# Patient Record
Sex: Male | Born: 1955 | Race: White | Hispanic: No | Marital: Single | Smoking: Never smoker
Health system: Southern US, Community
[De-identification: ages and names within clinical notes are randomized; demographics above are authoritative.]

## PROBLEM LIST (undated history)

## (undated) HISTORY — PX: OTHER SURGICAL HISTORY: SHX169

## (undated) HISTORY — PX: NOSE SURGERY: SHX723

---

## 2003-01-04 ENCOUNTER — Emergency Department (HOSPITAL_COMMUNITY): Admission: EM | Admit: 2003-01-04 | Discharge: 2003-01-04 | Payer: Self-pay | Admitting: Emergency Medicine

## 2004-05-22 ENCOUNTER — Emergency Department (HOSPITAL_COMMUNITY): Admission: EM | Admit: 2004-05-22 | Discharge: 2004-05-23 | Payer: Self-pay | Admitting: Emergency Medicine

## 2004-06-24 ENCOUNTER — Emergency Department (HOSPITAL_COMMUNITY): Admission: EM | Admit: 2004-06-24 | Discharge: 2004-06-24 | Payer: Self-pay | Admitting: *Deleted

## 2004-10-31 ENCOUNTER — Ambulatory Visit (HOSPITAL_COMMUNITY): Admission: RE | Admit: 2004-10-31 | Discharge: 2004-10-31 | Payer: Self-pay | Admitting: Orthopaedic Surgery

## 2007-01-11 ENCOUNTER — Ambulatory Visit: Payer: Self-pay | Admitting: Internal Medicine

## 2007-01-14 ENCOUNTER — Ambulatory Visit: Payer: Self-pay | Admitting: *Deleted

## 2007-03-01 ENCOUNTER — Ambulatory Visit (HOSPITAL_COMMUNITY): Admission: RE | Admit: 2007-03-01 | Discharge: 2007-03-01 | Payer: Self-pay | Admitting: Internal Medicine

## 2007-03-01 ENCOUNTER — Ambulatory Visit: Payer: Self-pay | Admitting: Internal Medicine

## 2007-04-16 ENCOUNTER — Ambulatory Visit: Payer: Self-pay | Admitting: Internal Medicine

## 2007-04-17 DIAGNOSIS — R972 Elevated prostate specific antigen [PSA]: Secondary | ICD-10-CM

## 2007-05-17 DIAGNOSIS — K029 Dental caries, unspecified: Secondary | ICD-10-CM | POA: Insufficient documentation

## 2007-05-17 DIAGNOSIS — E785 Hyperlipidemia, unspecified: Secondary | ICD-10-CM

## 2007-05-17 DIAGNOSIS — K219 Gastro-esophageal reflux disease without esophagitis: Secondary | ICD-10-CM | POA: Insufficient documentation

## 2007-11-27 ENCOUNTER — Ambulatory Visit: Payer: Self-pay | Admitting: Internal Medicine

## 2008-06-05 ENCOUNTER — Emergency Department (HOSPITAL_COMMUNITY): Admission: EM | Admit: 2008-06-05 | Discharge: 2008-06-06 | Payer: Self-pay | Admitting: Emergency Medicine

## 2008-06-19 ENCOUNTER — Ambulatory Visit (HOSPITAL_COMMUNITY): Admission: RE | Admit: 2008-06-19 | Discharge: 2008-06-19 | Payer: Self-pay | Admitting: Otolaryngology

## 2008-11-04 ENCOUNTER — Ambulatory Visit: Payer: Self-pay | Admitting: Internal Medicine

## 2008-11-20 ENCOUNTER — Ambulatory Visit (HOSPITAL_BASED_OUTPATIENT_CLINIC_OR_DEPARTMENT_OTHER): Admission: RE | Admit: 2008-11-20 | Discharge: 2008-11-20 | Payer: Self-pay | Admitting: Otolaryngology

## 2008-12-02 ENCOUNTER — Ambulatory Visit: Payer: Self-pay | Admitting: Internal Medicine

## 2008-12-04 ENCOUNTER — Ambulatory Visit: Payer: Self-pay | Admitting: Internal Medicine

## 2009-11-15 ENCOUNTER — Emergency Department (HOSPITAL_COMMUNITY): Admission: EM | Admit: 2009-11-15 | Discharge: 2009-11-16 | Payer: Self-pay | Admitting: Emergency Medicine

## 2009-12-13 ENCOUNTER — Encounter: Payer: Self-pay | Admitting: Emergency Medicine

## 2009-12-13 ENCOUNTER — Inpatient Hospital Stay (HOSPITAL_COMMUNITY): Admission: EM | Admit: 2009-12-13 | Discharge: 2009-12-14 | Payer: Self-pay | Admitting: Cardiology

## 2009-12-13 ENCOUNTER — Ambulatory Visit: Payer: Self-pay | Admitting: Diagnostic Radiology

## 2010-12-16 LAB — BASIC METABOLIC PANEL
CO2: 29 mEq/L (ref 19–32)
Calcium: 9.6 mg/dL (ref 8.4–10.5)
Chloride: 104 mEq/L (ref 96–112)
Creatinine, Ser: 1.01 mg/dL (ref 0.4–1.5)
Glucose, Bld: 101 mg/dL — ABNORMAL HIGH (ref 70–99)
Sodium: 140 mEq/L (ref 135–145)

## 2010-12-16 LAB — CK TOTAL AND CKMB (NOT AT ARMC): CK, MB: 1.9 ng/mL (ref 0.3–4.0)

## 2010-12-16 LAB — TROPONIN I: Troponin I: 0.01 ng/mL (ref 0.00–0.06)

## 2010-12-18 LAB — APTT: aPTT: 27 seconds (ref 24–37)

## 2010-12-18 LAB — COMPREHENSIVE METABOLIC PANEL
AST: 20 U/L (ref 0–37)
BUN: 7 mg/dL (ref 6–23)
CO2: 29 mEq/L (ref 19–32)
Calcium: 9.2 mg/dL (ref 8.4–10.5)
Creatinine, Ser: 0.87 mg/dL (ref 0.4–1.5)
GFR calc Af Amer: >60 mL/min (ref >60)
GFR calc non Af Amer: >60 mL/min (ref >60)
Total Bilirubin: 1 mg/dL (ref 0.3–1.2)

## 2010-12-18 LAB — CARDIAC PANEL(CRET KIN+CKTOT+MB+TROPI)
CK, MB: 1.7 ng/mL (ref 0.3–4.0)
Relative Index: INVALID (ref 0.0–2.5)
Total CK: 70 U/L (ref 7–232)
Troponin I: 0.01 ng/mL (ref 0.00–0.06)
Troponin I: <0.01 ng/mL (ref 0.00–0.06)

## 2010-12-18 LAB — PROTIME-INR
INR: 1.03 (ref 0.00–1.49)
Prothrombin Time: 13.4 seconds (ref 11.6–15.2)

## 2010-12-18 LAB — LIPID PANEL
Cholesterol: 214 mg/dL — ABNORMAL HIGH (ref 0–200)
HDL: 87 mg/dL (ref >39)
LDL Cholesterol: 99 mg/dL (ref 0–99)
Triglycerides: 139 mg/dL (ref <150)

## 2010-12-19 LAB — POCT CARDIAC MARKERS
CKMB, poc: 1.3 ng/mL (ref 1.0–8.0)
Myoglobin, poc: 43.9 ng/mL (ref 12–200)

## 2010-12-19 LAB — BASIC METABOLIC PANEL
CO2: 29 mEq/L (ref 19–32)
Calcium: 8.7 mg/dL (ref 8.4–10.5)
Chloride: 106 mEq/L (ref 96–112)
Creatinine, Ser: 0.9 mg/dL (ref 0.4–1.5)
Glucose, Bld: 92 mg/dL (ref 70–99)

## 2010-12-19 LAB — CBC
MCHC: 34.6 g/dL (ref 30.0–36.0)
MCV: 91.3 fL (ref 78.0–100.0)
RBC: 4.26 MIL/uL (ref 4.22–5.81)
RDW: 12.1 % (ref 11.5–15.5)

## 2010-12-19 LAB — DIFFERENTIAL
Basophils Absolute: 0 10*3/uL (ref 0.0–0.1)
Basophils Relative: 1 % (ref 0–1)
Eosinophils Absolute: 0.2 10*3/uL (ref 0.0–0.7)
Monocytes Absolute: 0.4 10*3/uL (ref 0.1–1.0)
Monocytes Relative: 8 % (ref 3–12)
Neutro Abs: 3 10*3/uL (ref 1.7–7.7)
Neutrophils Relative %: 65 % (ref 43–77)
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
## 2011-05-16 ENCOUNTER — Ambulatory Visit: Payer: Self-pay | Admitting: Rehabilitation

## 2011-05-17 ENCOUNTER — Ambulatory Visit: Payer: Self-pay | Attending: Family Medicine

## 2011-05-17 DIAGNOSIS — IMO0001 Reserved for inherently not codable concepts without codable children: Secondary | ICD-10-CM | POA: Insufficient documentation

## 2011-05-17 DIAGNOSIS — M25569 Pain in unspecified knee: Secondary | ICD-10-CM | POA: Insufficient documentation

## 2011-05-19 ENCOUNTER — Encounter: Payer: Self-pay | Admitting: Physical Therapy

## 2011-05-23 ENCOUNTER — Encounter: Payer: Self-pay | Admitting: Physical Therapy

## 2011-05-30 ENCOUNTER — Encounter: Payer: Self-pay | Admitting: Physical Therapy

## 2011-06-01 ENCOUNTER — Emergency Department (HOSPITAL_COMMUNITY)
Admission: EM | Admit: 2011-06-01 | Discharge: 2011-06-01 | Disposition: A | Discharge status: Home / Self Care | Payer: Self-pay | Attending: Emergency Medicine | Admitting: Emergency Medicine

## 2011-06-01 DIAGNOSIS — S01502A Unspecified open wound of oral cavity, initial encounter: Secondary | ICD-10-CM | POA: Insufficient documentation

## 2011-06-01 DIAGNOSIS — S0180XA Unspecified open wound of other part of head, initial encounter: Secondary | ICD-10-CM | POA: Insufficient documentation

## 2011-06-01 DIAGNOSIS — S0003XA Contusion of scalp, initial encounter: Secondary | ICD-10-CM | POA: Insufficient documentation

## 2011-06-01 DIAGNOSIS — S1093XA Contusion of unspecified part of neck, initial encounter: Secondary | ICD-10-CM | POA: Insufficient documentation

## 2011-06-03 ENCOUNTER — Emergency Department (HOSPITAL_COMMUNITY): Payer: Self-pay

## 2011-06-03 ENCOUNTER — Emergency Department (HOSPITAL_COMMUNITY)
Admission: EM | Admit: 2011-06-03 | Discharge: 2011-06-03 | Disposition: A | Discharge status: Home / Self Care | Payer: Self-pay | Attending: Emergency Medicine | Admitting: Emergency Medicine

## 2011-06-03 DIAGNOSIS — M47812 Spondylosis without myelopathy or radiculopathy, cervical region: Secondary | ICD-10-CM | POA: Insufficient documentation

## 2011-06-03 DIAGNOSIS — R51 Headache: Secondary | ICD-10-CM | POA: Insufficient documentation

## 2011-06-03 DIAGNOSIS — S139XXA Sprain of joints and ligaments of unspecified parts of neck, initial encounter: Secondary | ICD-10-CM | POA: Insufficient documentation

## 2011-06-03 DIAGNOSIS — S0990XA Unspecified injury of head, initial encounter: Secondary | ICD-10-CM | POA: Insufficient documentation

## 2011-06-03 DIAGNOSIS — S0510XA Contusion of eyeball and orbital tissues, unspecified eye, initial encounter: Secondary | ICD-10-CM | POA: Insufficient documentation

## 2011-06-06 ENCOUNTER — Encounter: Payer: Self-pay | Admitting: Physical Therapy

## 2011-06-08 ENCOUNTER — Encounter: Payer: Self-pay | Admitting: Physical Therapy

## 2011-06-26 LAB — CBC
Platelets: 305
RDW: 12.8

## 2012-01-17 ENCOUNTER — Emergency Department (HOSPITAL_COMMUNITY)
Admission: EM | Admit: 2012-01-17 | Discharge: 2012-01-17 | Disposition: A | Discharge status: Home / Self Care | Payer: Self-pay | Attending: Emergency Medicine | Admitting: Emergency Medicine

## 2012-01-17 ENCOUNTER — Encounter (HOSPITAL_COMMUNITY): Payer: Self-pay | Admitting: Emergency Medicine

## 2012-01-17 DIAGNOSIS — R51 Headache: Secondary | ICD-10-CM | POA: Insufficient documentation

## 2012-01-17 DIAGNOSIS — R209 Unspecified disturbances of skin sensation: Secondary | ICD-10-CM | POA: Insufficient documentation

## 2012-01-17 DIAGNOSIS — M898X1 Other specified disorders of bone, shoulder: Secondary | ICD-10-CM

## 2012-01-17 DIAGNOSIS — M25519 Pain in unspecified shoulder: Secondary | ICD-10-CM | POA: Insufficient documentation

## 2012-01-17 DIAGNOSIS — M542 Cervicalgia: Secondary | ICD-10-CM | POA: Insufficient documentation

## 2012-01-17 MED ORDER — CYCLOBENZAPRINE HCL 10 MG PO TABS: 10.0000 mg | ORAL_TABLET | Freq: Three times a day (TID) | ORAL | Status: AC | PRN

## 2012-01-17 MED ORDER — IBUPROFEN 800 MG PO TABS
800.0000 mg | ORAL_TABLET | Freq: Once | ORAL | Status: AC
  Administered 2012-01-17: 800 mg via ORAL
  Filled 2012-01-17: qty 1

## 2012-01-17 MED ORDER — IBUPROFEN 800 MG PO TABS: 800.0000 mg | ORAL_TABLET | Freq: Three times a day (TID) | ORAL | Status: AC

## 2012-01-17 MED ORDER — OXYCODONE-ACETAMINOPHEN 5-325 MG PO TABS: 1.0000 | ORAL_TABLET | ORAL | Status: AC | PRN

## 2012-01-17 MED ORDER — OXYCODONE-ACETAMINOPHEN 5-325 MG PO TABS
1.0000 | ORAL_TABLET | Freq: Once | ORAL | Status: AC
  Administered 2012-01-17: 1 via ORAL
  Filled 2012-01-17: qty 1
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
## 2012-01-17 NOTE — Discharge Instructions (Signed)
Mr Honse we gave you ibuprofen and percocet in the ER today.  Take the ibuprofen 800mg  with food every 6 hours x 24 hours .  Use ice to the site intermitantly as well.  Return to the ER for uncontrolled pain or loss of sensation/ to the LUE.  Do not drive with the percocet or flexeril.  Follow up with Dr. August Saucer the orthopedist if not better in a few days.   Arthralgia Arthralgia is joint pain. A joint is a place where two bones meet. Joint pain can happen for many reasons. The joint can be bruised, stiff, infected, or weak from aging. Pain usually goes away after resting and taking medicine for soreness.  HOME CARE  Rest the joint as told by your doctor.   Keep the sore joint raised (elevated) for the first 24 hours.   Put ice on the joint area.   Put ice in a plastic bag.   Place a towel between your skin and the bag.   Leave the ice on for 15 to 20 minutes, 3 to 4 times a day.   Wear your splint, casting, elastic bandage, or sling as told by your doctor.   Only take medicine as told by your doctor. Do not take aspirin.   Use crutches as told by your doctor. Do not put weight on the joint until told to by your doctor.  GET HELP RIGHT AWAY IF:   You have bruising, puffiness (swelling), or more pain.   Your fingers or toes turn blue or start to lose feeling (numb).   Your medicine does not lessen the pain.   Your pain becomes severe.   You have a temperature by mouth above 102 F (38.9 C), not controlled by medicine.   You cannot move or use the joint.  MAKE SURE YOU:   Understand these instructions.   Will watch your condition.   Will get help right away if you are not doing well or get worse.  Document Released: 08/30/2009 Document Revised: 08/31/2011 Document Reviewed: 08/30/2009 [facility] Patient Information 2012 [place], [place].  Arthralgia Arthralgia is joint pain. A joint is a place where two bones meet. Joint pain can happen for many reasons. The joint can be  bruised, stiff, infected, or weak from aging. Pain usually goes away after resting and taking medicine for soreness.  HOME CARE  Rest the joint as told by your doctor.   Keep the sore joint raised (elevated) for the first 24 hours.   Put ice on the joint area.   Put ice in a plastic bag.   Place a towel between your skin and the bag.   Leave the ice on for 15 to 20 minutes, 3 to 4 times a day.   Wear your splint, casting, elastic bandage, or sling as told by your doctor.   Only take medicine as told by your doctor. Do not take aspirin.   Use crutches as told by your doctor. Do not put weight on the joint until told to by your doctor.  GET HELP RIGHT AWAY IF:   You have bruising, puffiness (swelling), or more pain.   Your fingers or toes turn blue or start to lose feeling (numb).   Your medicine does not lessen the pain.   Your pain becomes severe.   You have a temperature by mouth above 102 F (38.9 C), not controlled by medicine.   You cannot move or use the joint.  MAKE SURE YOU:   Understand  these instructions.   Will watch your condition.   Will get help right away if you are not doing well or get worse.  Document Released: 08/30/2009 Document Revised: 08/31/2011 Document Reviewed: 08/30/2009 [facility] Patient Information 2012 [place], [place].
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
## 2012-01-19 NOTE — ED Provider Notes (Signed)
Medical screening examination/treatment/procedure(s) were performed by non-physician practitioner and as supervising physician I was immediately available for consultation/collaboration.  Toy Baker, MD 01/19/12 1755

## 2012-02-28 ENCOUNTER — Encounter (HOSPITAL_COMMUNITY): Payer: Self-pay

## 2012-02-28 ENCOUNTER — Emergency Department (HOSPITAL_COMMUNITY)
Admission: EM | Admit: 2012-02-28 | Discharge: 2012-02-28 | Disposition: A | Discharge status: Home / Self Care | Payer: Self-pay | Attending: Emergency Medicine | Admitting: Emergency Medicine

## 2012-02-28 DIAGNOSIS — M542 Cervicalgia: Secondary | ICD-10-CM | POA: Insufficient documentation

## 2012-02-28 DIAGNOSIS — R51 Headache: Secondary | ICD-10-CM | POA: Insufficient documentation

## 2012-02-28 DIAGNOSIS — G44309 Post-traumatic headache, unspecified, not intractable: Secondary | ICD-10-CM

## 2012-02-28 NOTE — Discharge Instructions (Signed)
RESOURCE GUIDE  Dental Problems  Patients with Medicaid: [facility]                     [address]                                           Phone:  [phone]                                                  If unable to pay or uninsured, contact:  [facility] or [facility] to become qualified for the adult dental clinic.  Chronic Pain Problems Contact [facility]  [phone] Patients need to be referred by their primary care doctor.  Insufficient Money for Medicine Contact [employer]:  call "211" or [facility] 271-5999.  No Primary Care Doctor Call [facility]  832-8000 Other agencies that provide inexpensive medical care    [facility]  832-8035    [facility]  832-7272    [facility]  [phone]    [facility] Clinic  832-4777    [facility]  [phone]    [facility]  272-1050  Substance Abuse Resources [facility]  336-882-2125 [facility] 336-784-9470 [facility] 336-285-9073 [facility] 336-845-3988 Residential & Outpatient Substance Abuse Program  800-659-3381  Psychological Services [facility]  832-9600 [facility]  378-7881 [facility]   800 853-5163 (emergency services 641-4993)  Abuse/Neglect [place] Child Abuse Hotline (336) 641-3795 [place] Child Abuse Hotline 800-378-5315 (After Hours)  Emergency Shelter [facility] (336) 271-5985  [facility] (336) 275-9566 [facility] (704) 372-4663  MRSA Hotline #:   832-7006    [place] Resources  [facility] [place] [facility]  [employer]                           [facility] [address] [place]                     [address]         [address]  [place]                                                [place]                              [place] Phone:  349-3220                                  Phone:  [phone]                   Phone:  [phone]  [facility] Phone:  342-8316  [place] Child Abuse Hotline (336) 342-1394 (336)   206-324-6722 (After Hours)     Post-Concussion Syndrome Post-concussion syndrome describes the symptoms that can occur after a head injury. These symptoms can last from weeks to months. CAUSES  It is not clear why some head injuries cause post-concussion syndrome. It can occur whether your head injury was mild or severe and whether you were wearing head protection or not.  SYMPTOMS  Memory difficulties.   Dizziness.   Headaches.   Double vision or blurry vision.   Sensitivity to light.   Hearing difficulties.   Depression.   Tiredness.   Weakness.   Difficulty with concentration.   Difficulty sleeping or staying asleep.   Vomiting.  DIAGNOSIS  There is no test to determine whether you have post-concussion syndrome. Your caregiver may order an imaging scan of your brain, such as a CT scan, to check for other problems that may be causing your symptoms (such as severe injury inside your skull). TREATMENT  Usually, these problems disappear over time without medical care. Your caregiver may prescribe medicine to help ease your symptoms. It is important to follow up with a neurologist to evaluate your recovery and address any lingering symptoms or issues. HOME CARE INSTRUCTIONS   Only take over-the-counter or prescription medicines for pain, discomfort, or fever as directed by your caregiver. Do not take aspirin. Aspirin can slow blood clotting.   Sleep with your head slightly elevated to help with headaches.   Avoid any situation where there is potential for another head injury (football, hockey, martial arts, horseback riding). Your condition will get worse every time you experience a  concussion. You should avoid these activities until you are evaluated by the appropriate follow-up caregivers.   Keep all follow-up appointments as directed by your caregiver.  SEEK IMMEDIATE MEDICAL CARE IF:  You develop confusion or unusual drowsiness.   You cannot wake the injured person.   You develop nausea or persistent, forceful vomiting.   You feel like you are moving when you are not (vertigo).   You notice the injured person's eyes moving rapidly back and forth. This may be a sign of vertigo.   You have convulsions or faint.   You have severe, persistent headaches that are not relieved by medicine.   You cannot use your arms or legs normally.   Your pupils change size.   You have clear or bloody discharge from the nose or ears.   Your problems are getting worse, not better.  MAKE SURE YOU:  Understand these instructions.   Will watch your condition.   Will get help right away if you are not doing well or get worse.  Document Released: 03/03/2002 Document Revised: 08/31/2011 Document Reviewed: 03/30/2011 [facility] Patient Information 2012 [place], [place].   Cervical Sprain A cervical sprain is an injury in the neck in which the ligaments are stretched or torn. The ligaments are the tissues that hold the bones of the neck (vertebrae) in place.Cervical sprains can range from very mild to very severe. Most cervical sprains get better in 1 to 3 weeks, but it depends on the cause and extent of the injury. Severe cervical sprains can cause the neck vertebrae to be unstable. This can lead to damage of the spinal cord and can result in serious nervous system problems. Your caregiver will determine whether your cervical sprain is mild or severe. CAUSES  Severe cervical sprains may be caused by:  Contact sport injuries (football, rugby, wrestling, hockey, auto racing, gymnastics, diving, martial arts, boxing).   Motor vehicle  collisions.   Whiplash injuries. This means  the neck is forcefully whipped backward and forward.   Falls.  Mild cervical sprains may be caused by:   Awkward positions, such as cradling a telephone between your ear and shoulder.   Sitting in a chair that does not offer proper support.   Working at a poorly Marketing executive station.   Activities that require looking up or down for long periods of time.  SYMPTOMS   Pain, soreness, stiffness, or a burning sensation in the front, back, or sides of the neck. This discomfort may develop immediately after injury or it may develop slowly and not begin for 24 hours or more after an injury.   Pain or tenderness directly in the middle of the back of the neck.   Shoulder or upper back pain.   Limited ability to move the neck.   Headache.   Dizziness.   Weakness, numbness, or tingling in the hands or arms.   Muscle spasms.   Difficulty swallowing or chewing.   Tenderness and swelling of the neck.  DIAGNOSIS  Most of the time, your caregiver can diagnose this problem by taking your history and doing a physical exam. Your caregiver will ask about any known problems, such as arthritis in the neck or a previous neck injury. X-rays may be taken to find out if there are any other problems, such as problems with the bones of the neck. However, an X-ray often does not reveal the full extent of a cervical sprain. Other tests such as a computed tomography (CT) scan or magnetic resonance imaging (MRI) may be needed. TREATMENT  Treatment depends on the severity of the cervical sprain. Mild sprains can be treated with rest, keeping the neck in place (immobilization), and pain medicines. Severe cervical sprains need immediate immobilization and an appointment with an orthopedist or neurosurgeon. Several treatment options are available to help with pain, muscle spasms, and other symptoms. Your caregiver may prescribe:  Medicines, such as pain relievers, numbing medicines, or muscle relaxants.    Physical therapy. This can include stretching exercises, strengthening exercises, and posture training. Exercises and improved posture can help stabilize the neck, strengthen muscles, and help stop symptoms from returning.   A neck collar to be worn for short periods of time. Often, these collars are worn for comfort. However, certain collars may be worn to protect the neck and prevent further worsening of a serious cervical sprain.  HOME CARE INSTRUCTIONS   Put ice on the injured area.   Put ice in a plastic bag.   Place a towel between your skin and the bag.   Leave the ice on for 15 to 20 minutes, 3 to 4 times a day.   Only take over-the-counter or prescription medicines for pain, discomfort, or fever as directed by your caregiver.   Keep all follow-up appointments as directed by your caregiver.   Keep all physical therapy appointments as directed by your caregiver.   If a neck collar is prescribed, wear it as directed by your caregiver.   Do not drive while wearing a neck collar.   Make any needed adjustments to your work station to promote good posture.   Avoid positions and activities that make your symptoms worse.   Warm up and stretch before being active to help prevent problems.  SEEK MEDICAL CARE IF:   Your pain is not controlled with medicine.   You are unable to decrease your pain medicine over time as planned.  Your activity level is not improving as expected.  SEEK IMMEDIATE MEDICAL CARE IF:   You develop any bleeding, stomach upset, or signs of an allergic reaction to your medicine.   Your symptoms get worse.   You develop new, unexplained symptoms.   You have numbness, tingling, weakness, or paralysis in any part of your body.  MAKE SURE YOU:   Understand these instructions.   Will watch your condition.   Will get help right away if you are not doing well or get worse.  Document Released: 07/09/2007 Document Revised: 08/31/2011 Document  Reviewed: 06/14/2011 [facility] Patient Information 2012 [place], [place].
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
## 2012-03-04 NOTE — ED Provider Notes (Signed)
Medical screening examination/treatment/procedure(s) were performed by non-physician practitioner and as supervising physician I was immediately available for consultation/collaboration. Devoria Albe, MD, [facility]   Ward Givens, MD 03/04/12 [phone]

## 2012-04-08 ENCOUNTER — Other Ambulatory Visit (HOSPITAL_COMMUNITY): Payer: Self-pay | Admitting: Orthopaedic Surgery

## 2012-04-08 DIAGNOSIS — M542 Cervicalgia: Secondary | ICD-10-CM

## 2012-04-12 ENCOUNTER — Ambulatory Visit (HOSPITAL_COMMUNITY)
Admission: RE | Admit: 2012-04-12 | Discharge: 2012-04-12 | Disposition: A | Discharge status: Home / Self Care | Payer: Self-pay | Source: Ambulatory Visit | Attending: Orthopaedic Surgery | Admitting: Orthopaedic Surgery

## 2012-04-12 DIAGNOSIS — M5030 Other cervical disc degeneration, unspecified cervical region: Secondary | ICD-10-CM | POA: Insufficient documentation

## 2012-04-12 DIAGNOSIS — M47812 Spondylosis without myelopathy or radiculopathy, cervical region: Secondary | ICD-10-CM | POA: Insufficient documentation

## 2012-04-12 DIAGNOSIS — R209 Unspecified disturbances of skin sensation: Secondary | ICD-10-CM | POA: Insufficient documentation

## 2012-04-12 DIAGNOSIS — M542 Cervicalgia: Secondary | ICD-10-CM

## 2012-04-26 ENCOUNTER — Other Ambulatory Visit: Payer: Self-pay | Admitting: Orthopaedic Surgery

## 2012-04-26 DIAGNOSIS — M542 Cervicalgia: Secondary | ICD-10-CM

## 2012-04-30 ENCOUNTER — Other Ambulatory Visit: Payer: Self-pay | Admitting: Orthopaedic Surgery

## 2012-04-30 ENCOUNTER — Ambulatory Visit
Admission: RE | Admit: 2012-04-30 | Discharge: 2012-04-30 | Disposition: A | Discharge status: Home / Self Care | Payer: No Typology Code available for payment source | Source: Ambulatory Visit | Attending: Orthopaedic Surgery | Admitting: Orthopaedic Surgery

## 2012-04-30 VITALS — BP 133/76 | HR 52

## 2012-04-30 DIAGNOSIS — M5126 Other intervertebral disc displacement, lumbar region: Secondary | ICD-10-CM

## 2012-04-30 DIAGNOSIS — M47812 Spondylosis without myelopathy or radiculopathy, cervical region: Secondary | ICD-10-CM

## 2012-04-30 DIAGNOSIS — M542 Cervicalgia: Secondary | ICD-10-CM

## 2012-04-30 MED ORDER — TRIAMCINOLONE ACETONIDE 40 MG/ML IJ SUSP (RADIOLOGY)
60.0000 mg | Freq: Once | INTRAMUSCULAR | Status: AC
  Administered 2012-04-30: 60 mg via EPIDURAL

## 2012-04-30 MED ORDER — IOHEXOL 300 MG/ML  SOLN
1.0000 mL | Freq: Once | INTRAMUSCULAR | Status: AC | PRN
  Administered 2012-04-30: 1 mL via EPIDURAL

## 2012-05-22 ENCOUNTER — Inpatient Hospital Stay: Admission: RE | Admit: 2012-05-22 | Payer: Self-pay | Source: Ambulatory Visit

## 2012-07-04 ENCOUNTER — Ambulatory Visit: Payer: Self-pay | Admitting: Physical Therapy

## 2012-07-18 ENCOUNTER — Ambulatory Visit: Payer: Self-pay | Admitting: Physical Therapy

## 2012-09-04 ENCOUNTER — Emergency Department (HOSPITAL_COMMUNITY)
Admission: EM | Admit: 2012-09-04 | Discharge: 2012-09-04 | Disposition: A | Discharge status: Home / Self Care | Payer: Self-pay | Attending: Emergency Medicine | Admitting: Emergency Medicine

## 2012-09-04 ENCOUNTER — Encounter (HOSPITAL_COMMUNITY): Payer: Self-pay | Admitting: *Deleted

## 2012-09-04 ENCOUNTER — Emergency Department (HOSPITAL_COMMUNITY): Payer: Self-pay

## 2012-09-04 DIAGNOSIS — R51 Headache: Secondary | ICD-10-CM | POA: Insufficient documentation

## 2012-09-04 DIAGNOSIS — R112 Nausea with vomiting, unspecified: Secondary | ICD-10-CM | POA: Insufficient documentation

## 2012-09-04 MED ORDER — SODIUM CHLORIDE 0.9 % IV BOLUS (SEPSIS)
1000.0000 mL | Freq: Once | INTRAVENOUS | Status: AC
  Administered 2012-09-04: 1000 mL via INTRAVENOUS

## 2012-09-04 MED ORDER — KETOROLAC TROMETHAMINE 30 MG/ML IJ SOLN
30.0000 mg | Freq: Once | INTRAMUSCULAR | Status: AC
Start: 2012-09-04 — End: 2012-09-04
  Administered 2012-09-04: 30 mg via INTRAVENOUS
  Filled 2012-09-04: qty 1

## 2012-09-04 MED ORDER — METOCLOPRAMIDE HCL 5 MG/ML IJ SOLN
10.0000 mg | Freq: Once | INTRAMUSCULAR | Status: AC
  Administered 2012-09-04: 10 mg via INTRAVENOUS
  Filled 2012-09-04: qty 2

## 2012-09-04 NOTE — ED Notes (Signed)
[id number]<IO> Expected date:<BR> Expected time:<BR> Means of arrival:<BR> Comments:<BR> Rm 18, EMS, 66 m, Headache
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
## 2012-09-04 NOTE — ED Provider Notes (Signed)
History     CSN: [id number]  Arrival date & time 09/04/12  2440   First MD Initiated Contact with Patient 09/04/12 [phone]      Chief Complaint  Patient presents with  . Headache    (Consider location/radiation/quality/duration/timing/severity/associated sxs/prior treatment) HPI The patient presents with an occipital headache since last night.  The pain is described as throbbing and rated as a 12/10.  The pain originates in the R occipital region and radiates to the R temporal and R eye.  He denies blurry vision, double vision or decrease in vision.  He states movement and bending over increases pain.  He reports an abrupt onset last night, with 3 episodes of vomiting and nausea.  Reports subjective fever. No neck pain. Denies drug use, reports one beer yesterday.  Denies trauma to the area.  History reviewed. No pertinent past medical history.  Past Surgical History  Procedure Date  . Nose surgery   . Dental implant     Family History  Problem Relation Age of Onset  . Cancer Father     History  Substance Use Topics  . Smoking status: Never Smoker   . Smokeless tobacco: Not on file  . Alcohol Use: No      Review of Systems All other systems negative except as documented in the HPI. All pertinent positives and negatives as reviewed in the HPI.  Allergies  Review of patient's allergies indicates no known allergies.  Home Medications  No current outpatient prescriptions on file.  BP 131/88  Pulse 107  Temp 97.9 F (36.6 C) (Oral)  Resp 22  SpO2 96%  Physical Exam  Nursing note and vitals reviewed. Constitutional: He is oriented to person, place, and time. He appears well-developed and well-nourished. He does not have a sickly appearance. No distress.  HENT:  Head: Normocephalic and atraumatic. Head is without raccoon's eyes, without Battle's sign, without abrasion, without contusion, without laceration and without right periorbital erythema.    Mouth/Throat:  Oropharynx is clear and moist.  Eyes: Conjunctivae normal and EOM are normal. Pupils are equal, round, and reactive to light.  Neck: Normal range of motion. Neck supple. No spinous process tenderness present. No Brudzinski's sign noted.  Cardiovascular: Normal rate and normal heart sounds.   Pulmonary/Chest: Effort normal and breath sounds normal.  Neurological: He is alert and oriented to person, place, and time. He has normal strength. No sensory deficit. He exhibits normal muscle tone. Coordination and gait normal. GCS eye subscore is 4. GCS verbal subscore is 5. GCS motor subscore is 6.  Skin: Skin is warm and dry. No rash noted.       No rash identified on scalp.     ED Course  Procedures (including critical care time)   0745 Discussed CT results with the patient.  Reports current pain 7/10.  0855am- The patient states that his headache is almost completely gone. The patient did not have thunder clap headache. The patient states that the headache was worse with movement of his neck and head. The patient is advised to return here as needed. Advised him to increase his fluid intake.   MDM  MDM Number of Diagnoses or Management Options    MDM Reviewed: nursing note and vitals Interpretation: CT scan       TONI HOFFMEISTER, PA-C 09/04/12 2536

## 2012-09-05 NOTE — ED Provider Notes (Signed)
Medical screening examination/treatment/procedure(s) were performed by non-physician practitioner and as supervising physician I was immediately available for consultation/collaboration.  Olivia Mackie, MD 09/05/12 [phone]

## 2013-07-02 ENCOUNTER — Encounter: Payer: Self-pay | Admitting: Physical Medicine & Rehabilitation

## 2014-02-23 ENCOUNTER — Encounter: Payer: Self-pay | Attending: Physical Medicine & Rehabilitation

## 2014-02-23 ENCOUNTER — Ambulatory Visit: Payer: Self-pay | Admitting: Physical Medicine & Rehabilitation

## 2014-05-26 ENCOUNTER — Telehealth: Payer: Self-pay

## 2014-05-26 NOTE — Telephone Encounter (Signed)
This is a WC chart. ia not was placed in med man for this.

## 2014-05-26 NOTE — Telephone Encounter (Signed)
Sean Proctor is calling back. States they spoke with someone yesterday that told them they would receive a phone call today. Sean Proctor did not specify what the call was regarding. Please return call at [phone]

## 2015-05-11 ENCOUNTER — Emergency Department (HOSPITAL_COMMUNITY)
Admission: EM | Admit: 2015-05-11 | Discharge: 2015-05-11 | Disposition: A | Discharge status: Home / Self Care | Payer: Self-pay | Attending: Emergency Medicine | Admitting: Emergency Medicine

## 2015-05-11 ENCOUNTER — Emergency Department (HOSPITAL_COMMUNITY): Payer: Self-pay

## 2015-05-11 ENCOUNTER — Encounter (HOSPITAL_COMMUNITY): Payer: Self-pay | Admitting: Emergency Medicine

## 2015-05-11 DIAGNOSIS — M25522 Pain in left elbow: Secondary | ICD-10-CM | POA: Insufficient documentation

## 2015-05-11 DIAGNOSIS — R2232 Localized swelling, mass and lump, left upper limb: Secondary | ICD-10-CM | POA: Insufficient documentation

## 2015-05-11 MED ORDER — OXYCODONE-ACETAMINOPHEN 5-325 MG PO TABS: 0.5000 | ORAL_TABLET | ORAL | Status: DC | PRN

## 2015-05-11 MED ORDER — MELOXICAM 15 MG PO TABS: 15.0000 mg | ORAL_TABLET | Freq: Every day | ORAL | Status: DC

## 2015-05-11 NOTE — Discharge Instructions (Signed)
Cryotherapy °Cryotherapy means treatment with cold. Ice or gel packs can be used to reduce both pain and swelling. Ice is the most helpful within the first 24 to 48 hours after an injury or flare-up from overusing a muscle or joint. Sprains, strains, spasms, burning pain, shooting pain, and aches can all be eased with ice. Ice can also be used when recovering from surgery. Ice is effective, has very few side effects, and is safe for most people to use. °PRECAUTIONS  °Ice is not a safe treatment option for people with: °· Raynaud phenomenon. This is a condition affecting small blood vessels in the extremities. Exposure to cold may cause your problems to return. °· Cold hypersensitivity. There are many forms of cold hypersensitivity, including: °· Cold urticaria. Red, itchy hives appear on the skin when the tissues begin to warm after being iced. °· Cold erythema. This is a red, itchy rash caused by exposure to cold. °· Cold hemoglobinuria. Red blood cells break down when the tissues begin to warm after being iced. The hemoglobin that carry oxygen are passed into the urine because they cannot combine with blood proteins fast enough. °· Numbness or altered sensitivity in the area being iced. °If you have any of the following conditions, do not use ice until you have discussed cryotherapy with your caregiver: °· Heart conditions, such as arrhythmia, angina, or chronic heart disease. °· High blood pressure. °· Healing wounds or open skin in the area being iced. °· Current infections. °· Rheumatoid arthritis. °· Poor circulation. °· Diabetes. °Ice slows the blood flow in the region it is applied. This is beneficial when trying to stop inflamed tissues from spreading irritating chemicals to surrounding tissues. However, if you expose your skin to cold temperatures for too long or without the proper protection, you can damage your skin or nerves. Watch for signs of skin damage due to cold. °HOME CARE INSTRUCTIONS °Follow  these tips to use ice and cold packs safely. °· Place a dry or damp towel between the ice and skin. A damp towel will cool the skin more quickly, so you may need to shorten the time that the ice is used. °· For a more rapid response, add gentle compression to the ice. °· Ice for no more than 10 to 20 minutes at a time. The bonier the area you are icing, the less time it will take to get the benefits of ice. °· Check your skin after 5 minutes to make sure there are no signs of a poor response to cold or skin damage. °· Rest 20 minutes or more between uses. °· Once your skin is numb, you can end your treatment. You can test numbness by very lightly touching your skin. The touch should be so light that you do not see the skin dimple from the pressure of your fingertip. When using ice, most people will feel these normal sensations in this order: cold, burning, aching, and numbness. °· Do not use ice on someone who cannot communicate their responses to pain, such as small children or people with dementia. °HOW TO MAKE AN ICE PACK °Ice packs are the most common way to use ice therapy. Other methods include ice massage, ice baths, and cryosprays. Muscle creams that cause a cold, tingly feeling do not offer the same benefits that ice offers and should not be used as a substitute unless recommended by your caregiver. °To make an ice pack, do one of the following: °· Place crushed ice or a   bag of frozen vegetables in a sealable plastic bag. Squeeze out the excess air. Place this bag inside another plastic bag. Slide the bag into a pillowcase or place a damp towel between your skin and the bag.  Mix 3 parts water with 1 part rubbing alcohol. Freeze the mixture in a sealable plastic bag. When you remove the mixture from the freezer, it will be slushy. Squeeze out the excess air. Place this bag inside another plastic bag. Slide the bag into a pillowcase or place a damp towel between your skin and the bag. SEEK MEDICAL CARE  IF:  You develop white spots on your skin. This may give the skin a blotchy (mottled) appearance.  Your skin turns blue or pale.  Your skin becomes waxy or hard.  Your swelling gets worse. MAKE SURE YOU:   Understand these instructions.  Will watch your condition.  Will get help right away if you are not doing well or get worse. Document Released: 05/08/2011 Document Revised: 01/26/2014 Document Reviewed: 05/08/2011 [facility] Patient Information 2015 [place], [place]. This information is not intended to replace advice given to you by your health care provider. Make sure you discuss any questions you have with your health care provider. Olecranon Bursitis  with Rehab A bursa is a fluid-filled sac that is located between soft tissues (ligaments, tendons, skin) and bones. The purpose of a bursa is to allow the soft tissue to function smoothly, without friction. The olecranon bursa is located between the back of the elbow (olecranon) and the skin. Olecranon bursitis involves inflammation of this bursa, resulting in pain. SYMPTOMS   Pain, tenderness, swelling, warmth, or redness over the back of the elbow.  Reduced range of motion of the affected elbow.  Sometimes, severe pain with movement of the affected elbow.  Crackling sound (crepitation) when the bursa is moved or touched.  Often, painless swelling of the bursa.  Fever (when infected). CAUSES  Olecranon bursitis is often caused by direct hit (trauma) to the elbow. Less commonly, it is due to overuse and/or strenuous exercise that the elbow is not used to. RISK INCREASES WITH:  Sports that require bending or landing on the elbow (football, volleyball).  Vigorous or repetitive athletic training, or sudden increase or change in activity level (weekend warriors).  Failure to warm up properly before activity.  Poor exercise technique.  Playing on artificial turf. PREVENTION  Avoid injuries and the overuse of muscles  whenever possible.  Warm up and stretch properly before activity.  Allow for adequate recovery between workouts.  Maintain physical fitness:  Strength, flexibility, and endurance.  Cardiovascular fitness.  Learn and use proper technique.  Wear properly fitted and padded protective equipment. PROGNOSIS  If treated properly, olecranon bursitis is usually curable within 2 weeks.  RELATED COMPLICATIONS   Longer healing time, if not properly treated or if not given enough time to heal.  Recurring symptoms that result in a chronic problem.  Joint stiffness with permanent limitation of the affected joint's movement.  Infection of the bursa.  Chronic inflammation or scarring of the bursa. TREATMENT Treatment first involves the use of ice and medicine to reduce pain and inflammation. The use of strengthening and stretching exercises may help reduce pain with activity. These exercises may be performed at home or with a therapist. Elbow pads may be advised to protect the bursa. If symptoms persist despite nonsurgical treatment, a procedure to withdraw fluid from the bursa may be advised. This procedure may be accompanied with an injection  of corticosteroids to reduce inflammation. Sometimes, surgery is needed to remove the bursa. MEDICATION  If pain medicine is needed, nonsteroidal anti-inflammatory medicines (aspirin and ibuprofen), or other minor pain relievers (acetaminophen), are often advised.  Do not take pain medicine for 7 days before surgery.  Prescription pain relievers may be given, if your caregiver thinks they are needed. Use only as directed and only as much as you need.  Corticosteroid injections may be given by your caregiver. These injections should be reserved for the most serious cases, because they may only be given a certain number of times. HEAT AND COLD  Cold treatment (icing) should be applied for 10 to 15 minutes every 2 to 3 hours for inflammation and pain, and  immediately after activity that aggravates your symptoms. Use ice packs or an ice massage.  Heat treatment may be used before performing stretching and strengthening activities prescribed by your caregiver, physical therapist, or athletic trainer. Use a heat pack or a warm water soak. SEEK IMMEDIATE MEDICAL CARE IF:   Symptoms get worse or do not improve in 2 weeks, despite treatment.  Signs of infection develop, including fever of 102 F (38.9 C), increased pain, redness, warmth, or pus draining from the bursa.  New, unexplained symptoms develop. (Drugs used in treatment may produce side effects.) EXERCISES  RANGE OF MOTION (ROM) AND STRETCHING EXERCISES - Olecranon Bursitis These exercises may help you when beginning to rehabilitate your injury. Your symptoms may resolve with or without further involvement from your physician, physical therapist or athletic trainer. While completing these exercises, remember:   Restoring tissue flexibility helps normal motion to return to the joints. This allows healthier, less painful movement and activity.  An effective stretch should be held for at least 30 seconds.  A stretch should never be painful. You should only feel a gentle lengthening or release in the stretched tissue. RANGE OF MOTION - Elbow Flexion, Supine  Lie on your back. Extend your right / left arm into the air, bracing it with your opposite hand. Allow your right / left arm to relax.  Let your elbow bend, allowing your hand to fall slowly toward your chest.  You should feel a gentle stretch along the back of your upper arm and elbow. Your physician, physical therapist or athletic trainer may ask you to hold a __________ hand weight to increase the intensity of this stretch.  Hold for __________ seconds. Slowly return your right / left arm to the upright position. Repeat __________ times. Complete this exercise __________ times per day. STRETCH - Elbow Flexors  Lie on a firm bed  or countertop on your back. Be sure that you are in a comfortable position which will allow you to relax your arm muscles.  Place a folded towel under your right / left upper arm, so that your elbow and shoulder are at the same height. Extend your arm; your elbow should not rest on the bed or towel  Allow the weight of your hand to straighten your elbow. Keep your arm and chest muscles relaxed. Your caregiver may ask you to increase the intensity of your stretch by adding a small wrist or hand weight.  Hold for __________ seconds. You should feel a stretch on the inside of your elbow. Slowly return to the starting position. Repeat __________ times. Complete this exercise __________ times per day. STRENGTHENING EXERCISES - Olecranon Bursitis These exercises will help you regain your strength. They may resolve your symptoms with or without further involvement  from your physician, physical therapist or athletic trainer. While completing these exercises, remember:   Muscles can gain both the endurance and the strength needed for everyday activities through controlled exercises.  Complete these exercises as instructed by your physician, physical therapist or athletic trainer. Increase the resistance and repetitions only as guided by your caregiver.  You may experience muscle soreness or fatigue, but the pain or discomfort you are trying to eliminate should never worsen during these exercises. If this pain does worsen, stop and make certain you are following the directions exactly. If the pain is still present after adjustments, discontinue the exercise until you can discuss the trouble with your caregiver. STRENGTH - Elbow Extensors, Isometric  Stand or sit upright on a firm surface. Place your right / left arm so that your palm faces your stomach, and it is at the height of your waist.  Place your opposite hand on the underside of your forearm. Gently push up as your right / left arm resists. Push as  hard as you can with both arms without causing any pain or movement at your right / left elbow. Hold this stationary position for __________ seconds.  Gradually release the tension in both arms. Allow your muscles to relax completely before repeating. Repeat __________ times. Complete this exercise __________ times per day. STRENGTH - Elbow Flexors, Isometric  Stand or sit upright on a firm surface. Place your right / left arm so that your hand is palm-up and at the height of your waist.  Place your opposite hand on top of your forearm. Gently push down as your right / left arm resists. Push as hard as you can with both arms without causing any pain or movement at your right / left elbow. Hold this stationary position for __________ seconds.  Gradually release the tension in both arms. Allow your muscles to relax completely before repeating. Repeat __________ times. Complete this exercise __________ times per day. STRENGTH - Elbow Flexors, Supinated  With good posture, stand or sit on a firm chair without armrests. Allow your right / left arm to rest at your side with your palm facing forward.  Holding a __________ weight, or gripping a rubber exercise band or tubing,  bring your hand toward your shoulder.  Allow your muscles to control the resistance as your hand returns to your side. Repeat __________ times. Complete this exercise __________ times per day.  STRENGTH - Elbow Flexors, Neutral  With good posture, stand or sit on a firm chair without armrests. Allow your right / left arm to rest at your side with your thumb facing forward.  Holding a __________weight, or gripping a rubber exercise band or tubing,  bring your hand toward your shoulder.  Allow your muscles to control the resistance as your hand returns to your side. Repeat __________ times. Complete this exercise __________ times per day.  STRENGTH - Elbow Extensors  Lie on your back. Extend your right / left elbow into  the air, pointing it toward the ceiling. Brace your arm with your opposite hand.*  Holding a __________ weight in your hand, slowly straighten your right / left elbow.  Allow your muscles to control the weight as your hand returns to its starting position. Repeat __________ times. Complete this exercise __________ times per day. *You may also stand with your elbow overhead and pointed toward the ceiling, supported by your opposite hand. STRENGTH - Elbow Extensors, Dynamic  With good posture, stand, or sit on a firm chair without  armrests. Keeping your upper arms at your side, bring both hands up to your right / left shoulder while gripping a rubber exercise band or tubing. Your right / left hand should be just below the other hand.  Straighten your right / left elbow. Hold for __________ seconds.  Allow your muscles to control the rubber exercise band, as your hand returns to your shoulder. Repeat __________ times. Complete this exercise __________ times per day. Document Released: 09/11/2005 Document Revised: 01/26/2014 Document Reviewed: 12/24/2008 [facility] Patient Information 2015 [place], [place]. This information is not intended to replace advice given to you by your health care provider. Make sure you discuss any questions you have with your health care provider.

## 2015-05-11 NOTE — ED Notes (Signed)
Pt was on elbows in a crawl space helping a friend and woke up and left elbow was swollen a few weeks ago. Been improving and has more mobility. Ice and heat at night. Bothersome at night.

## 2015-05-11 NOTE — ED Provider Notes (Signed)
CSN: [id number]     Arrival date & time 05/11/15  1212 History  This chart was scribed for non-physician practitioner, Arthor Captain, PA-C, working with Linwood Dibbles, MD by Charline Bills, ED Scribe. This patient was seen in room TR10C/TR10C and the patient's care was started at 3:16 PM.   Chief Complaint  Patient presents with  . Joint Swelling   The history is provided by the patient. No language interpreter was used.   HPI Comments: Sean Proctor is a 60 y.o. male who presents to the Emergency Department complaining of gradually improving left elbow swelling for the past 3-4 weeks. Pt was crawling on his elbows in a crawl space a few weeks ago and woke up the next day with joint swelling. He reports associated warmth and moderate pain to the area that is exacerbated with movement and worse at night. He has noticed improved mobility over the past few weeks. He has been treating with Mobic, Aleve, heat and ice without significant relief. Pt states that he has contacted ortho but could not be seen due to lack of insurance.   History reviewed. No pertinent past medical history. Past Surgical History  Procedure Laterality Date  . Nose surgery    . Dental implant     Family History  Problem Relation Age of Onset  . Cancer Father    Social History  Substance Use Topics  . Smoking status: Never Smoker   . Smokeless tobacco: None  . Alcohol Use: No    Review of Systems  Musculoskeletal: Positive for joint swelling and arthralgias.   Allergies  Review of patient's allergies indicates no known allergies.  Home Medications   Prior to Admission medications   Not on File   BP 126/87 mmHg  Pulse 78  Temp(Src) 98.1 F (36.7 C) (Oral)  Resp 16  SpO2 96% Physical Exam  Constitutional: He is oriented to person, place, and time. He appears well-developed and well-nourished. No distress.  HENT:  Head: Normocephalic and atraumatic.  Eyes: Conjunctivae and EOM are normal.  Neck:  Neck supple. No tracheal deviation present.  Cardiovascular: Normal rate.   Pulmonary/Chest: Effort normal. No respiratory distress.  Musculoskeletal: Normal range of motion.  L elbow: Mild swelling. ROM limited due to pain. NVI. No heat, redness or signs of infection.   Neurological: He is alert and oriented to person, place, and time.  Skin: Skin is warm and dry.  Psychiatric: He has a normal mood and affect. His behavior is normal.  Nursing note and vitals reviewed.  ED Course  Procedures (including critical care time) DIAGNOSTIC STUDIES: Oxygen Saturation is 96% on RA, adequate by my interpretation.    COORDINATION OF CARE: 3:29 PM-Discussed treatment plan which includes XR, Mobic and follow-up with ortho with pt at bedside and pt agreed to plan.   Labs Review Labs Reviewed - No data to display  Imaging Review Dg Elbow Complete Left  05/11/2015   CLINICAL DATA:  Posterior left elbow pain after working in attic yesterday. Swelling.  EXAM: LEFT ELBOW - COMPLETE 3+ VIEW  COMPARISON:  04/20/2015  FINDINGS: There is no evidence of fracture, dislocation, or joint effusion. There is no evidence of arthropathy or other focal bone abnormality. Soft tissues are unremarkable.  IMPRESSION: Negative.   Electronically Signed   By: Charlett Nose M.D.   On: 05/11/2015 14:06   I have personally reviewed and evaluated these images and lab results as part of my medical decision-making.   EKG Interpretation None  MDM   Final diagnoses:  Elbow pain, left    Patient with mild left elbow joint swelling. No heat, redness or signs of infection. Patient states his symptoms are improving over the past month. Patient given pain medication to help him tolerate nighttime symptoms and sleeping for comfort. Have asked the patient to remove the elbow several times daily to mobilize it. Given referral to hand specialist. Patient appears safe for discharge at this time.  I personally performed the  services described in this documentation, which was scribed in my presence. The recorded information has been reviewed and is accurate.   Arthor Captain, PA-C 05/11/15 1631  Linwood Dibbles, MD 05/12/15 1130

## 2015-05-11 NOTE — ED Notes (Signed)
Patient here "to have my elbow drained.  It has a lot of infection in it".

## 2015-10-25 ENCOUNTER — Emergency Department (HOSPITAL_COMMUNITY)
Admission: EM | Admit: 2015-10-25 | Discharge: 2015-10-25 | Disposition: A | Discharge status: Home / Self Care | Payer: Self-pay | Attending: Emergency Medicine | Admitting: Emergency Medicine

## 2015-10-25 DIAGNOSIS — L259 Unspecified contact dermatitis, unspecified cause: Secondary | ICD-10-CM | POA: Insufficient documentation

## 2015-10-25 DIAGNOSIS — Z791 Long term (current) use of non-steroidal anti-inflammatories (NSAID): Secondary | ICD-10-CM | POA: Insufficient documentation

## 2015-10-25 MED ORDER — DEXAMETHASONE SODIUM PHOSPHATE 10 MG/ML IJ SOLN
10.0000 mg | Freq: Once | INTRAMUSCULAR | Status: AC
  Administered 2015-10-25: 10 mg via INTRAMUSCULAR
  Filled 2015-10-25: qty 1

## 2015-10-25 MED ORDER — CEPHALEXIN 500 MG PO CAPS: 500.0000 mg | ORAL_CAPSULE | Freq: Four times a day (QID) | ORAL | Status: DC

## 2015-10-25 MED ORDER — PREDNISONE 20 MG PO TABS: 60.0000 mg | ORAL_TABLET | Freq: Every day | ORAL | Status: DC

## 2015-10-25 MED ORDER — HYDROCORTISONE 1 % EX CREA: TOPICAL_CREAM | CUTANEOUS | Status: DC

## 2015-10-25 NOTE — ED Provider Notes (Signed)
CSN: [id number]     Arrival date & time 10/25/15  1153 History  By signing my name below, I, Placido Sou, attest that this documentation has been prepared under the direction and in the presence of [employer], PA-C. Electronically Signed: Placido Sou, ED Scribe. 10/25/2015. 12:48 PM.    Chief Complaint  Patient presents with  . Allergic Reaction   The history is provided by the patient. No language interpreter was used.    HPI Comments: Sean Proctor is a 60 y.o. male who presents to the Emergency Department complaining of a worsening, moderate, rash to his bilateral UEs and LLE onset 5 days ago. He notes he was initially raking leaves in short sleeves with it beginning the next day. Pt reports associated itchiness, pain and redness from the affected regions with mild clear drainage from the left forearm. He applied bleach to the rash yesterday which provided no relief. He denies any new soaps, detergents, lotions or medications. Pt denies lip swelling, tongue swelling, SOB, n/v, fevers or any other associated symptoms at this time.    No past medical history on file. Past Surgical History  Procedure Laterality Date  . Nose surgery    . Dental implant     Family History  Problem Relation Age of Onset  . Cancer Father    Social History  Substance Use Topics  . Smoking status: Never Smoker   . Smokeless tobacco: Not on file  . Alcohol Use: No    Review of Systems  All other systems reviewed and are negative.   Allergies  Review of patient's allergies indicates no known allergies.  Home Medications   Prior to Admission medications   Medication Sig Start Date End Date Taking? Authorizing Provider  meloxicam (MOBIC) 15 MG tablet Take 1 tablet (15 mg total) by mouth daily. Take 1 daily with food. 05/11/15   Arthor Captain, PA-C  oxyCODONE-acetaminophen (PERCOCET/ROXICET) 5-325 MG per tablet Take 0.5-1 tablets by mouth every 4 (four) hours as needed for severe  pain. May take 2 tablets PO q 6 hours for severe pain - Do not take with Tylenol as this tablet already contains tylenol 05/11/15   Arthor Captain, PA-C   BP 146/96 mmHg  Pulse 96  Temp(Src) 97.9 F (36.6 C) (Oral)  Resp 18  SpO2 96%    Physical Exam  Constitutional: He is oriented to person, place, and time. He appears well-developed and well-nourished.  HENT:  Head: Normocephalic and atraumatic.  Mouth/Throat: Oropharynx is clear and moist.  Eyes: EOM are normal.  Neck: Normal range of motion.  Cardiovascular: Normal rate.   Pulmonary/Chest: Effort normal. No respiratory distress.  Abdominal: Soft.  Musculoskeletal: Normal range of motion.  Neurological: He is alert and oriented to person, place, and time.  Skin: Skin is warm and dry. Rash noted. There is erythema.  erythematous papular rash to volar aspect of right and left forearm; clear drainage present on left forearm; also present on anterior aspect of left lower leg  Psychiatric: He has a normal mood and affect.  Nursing note and vitals reviewed.   ED Course  Procedures  DIAGNOSTIC STUDIES: Oxygen Saturation is 96% on RA, normal by my interpretation.    COORDINATION OF CARE: 12:44 PM Pt presents today due to a rash. Discussed treatment plan with pt at bedside including a decadron injection. Return precautions noted. Pt agreed to plan.  Labs Review Labs Reviewed - No data to display  Imaging Review No results found.  EKG Interpretation None      MDM   Final diagnoses:  None   Patient presents today with a rash located on both forearms and left lower leg.  Onset of rash 5 days ago after raking leaves.  Appearance of rash consistent with Poison Ivy/Oak.  Due to the severity of the rash, will place on oral Prednisone.  Patient also instructed to take Benadryl for itching and apply Hydrocortisone.  Concern for superimposed localized infection of the area.  Therefore, will give Rx for Keflex.  Patient stable for  discharge.  Return precautions given.  I personally performed the services described in this documentation, which was scribed in my presence. The recorded information has been reviewed and is accurate.    Santiago Glad, PA-C 10/25/15 1513  Santiago Glad, PA-C 10/25/15 1513  Mancel Bale, MD 10/26/15 [phone]

## 2015-10-25 NOTE — ED Notes (Signed)
Patient c/o of allergic reaction. He states he was raking leaves on Thursday. He noticed rash on bilateral arms and left arm. Complains of itching, painful and soreness on extremities. Patient has being taking anhistamine over the counter and using lotions.

## 2015-11-16 ENCOUNTER — Emergency Department (HOSPITAL_COMMUNITY)
Admission: EM | Admit: 2015-11-16 | Discharge: 2015-11-16 | Disposition: A | Discharge status: Home / Self Care | Payer: Self-pay | Attending: Emergency Medicine | Admitting: Emergency Medicine

## 2015-11-16 ENCOUNTER — Encounter (HOSPITAL_COMMUNITY): Payer: Self-pay | Admitting: Emergency Medicine

## 2015-11-16 ENCOUNTER — Emergency Department (HOSPITAL_COMMUNITY): Payer: Self-pay

## 2015-11-16 DIAGNOSIS — W1839XA Other fall on same level, initial encounter: Secondary | ICD-10-CM | POA: Insufficient documentation

## 2015-11-16 DIAGNOSIS — M25561 Pain in right knee: Secondary | ICD-10-CM

## 2015-11-16 DIAGNOSIS — S8991XA Unspecified injury of right lower leg, initial encounter: Secondary | ICD-10-CM | POA: Insufficient documentation

## 2015-11-16 DIAGNOSIS — S99912A Unspecified injury of left ankle, initial encounter: Secondary | ICD-10-CM | POA: Insufficient documentation

## 2015-11-16 DIAGNOSIS — Z792 Long term (current) use of antibiotics: Secondary | ICD-10-CM | POA: Insufficient documentation

## 2015-11-16 DIAGNOSIS — Y9289 Other specified places as the place of occurrence of the external cause: Secondary | ICD-10-CM | POA: Insufficient documentation

## 2015-11-16 DIAGNOSIS — Z7952 Long term (current) use of systemic steroids: Secondary | ICD-10-CM | POA: Insufficient documentation

## 2015-11-16 DIAGNOSIS — Z791 Long term (current) use of non-steroidal anti-inflammatories (NSAID): Secondary | ICD-10-CM | POA: Insufficient documentation

## 2015-11-16 DIAGNOSIS — Y9389 Activity, other specified: Secondary | ICD-10-CM | POA: Insufficient documentation

## 2015-11-16 DIAGNOSIS — M25572 Pain in left ankle and joints of left foot: Secondary | ICD-10-CM

## 2015-11-16 DIAGNOSIS — W19XXXA Unspecified fall, initial encounter: Secondary | ICD-10-CM

## 2015-11-16 DIAGNOSIS — Y998 Other external cause status: Secondary | ICD-10-CM | POA: Insufficient documentation

## 2015-11-16 NOTE — ED Notes (Signed)
Per pt, states he fell last week, states right knee and left ankle pain

## 2015-11-16 NOTE — Discharge Instructions (Signed)
May take tylenol or motrin as needed for pain. Follow-up with orthopedics if you continue having issues over the next few days. Return to the ED for new or worsening symptoms.
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
## 2017-11-25 ENCOUNTER — Emergency Department (HOSPITAL_COMMUNITY): Payer: No Typology Code available for payment source

## 2017-11-25 ENCOUNTER — Encounter (HOSPITAL_COMMUNITY): Payer: Self-pay | Admitting: *Deleted

## 2017-11-25 ENCOUNTER — Emergency Department (HOSPITAL_COMMUNITY)
Admission: EM | Admit: 2017-11-25 | Discharge: 2017-11-25 | Disposition: A | Discharge status: Home / Self Care | Payer: No Typology Code available for payment source | Attending: Emergency Medicine | Admitting: Emergency Medicine

## 2017-11-25 DIAGNOSIS — R0781 Pleurodynia: Secondary | ICD-10-CM

## 2017-11-25 DIAGNOSIS — M25571 Pain in right ankle and joints of right foot: Secondary | ICD-10-CM | POA: Insufficient documentation

## 2017-11-25 DIAGNOSIS — M25561 Pain in right knee: Secondary | ICD-10-CM | POA: Diagnosis not present

## 2017-11-25 DIAGNOSIS — R0789 Other chest pain: Secondary | ICD-10-CM | POA: Insufficient documentation

## 2017-11-25 MED ORDER — CYCLOBENZAPRINE HCL 5 MG PO TABS: 5.0000 mg | ORAL_TABLET | Freq: Two times a day (BID) | ORAL | 0 refills | Status: DC | PRN
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
## 2017-11-25 NOTE — Progress Notes (Signed)
ED CM received consult concerning assisting patient with finding primary care. Noted patient to be uninsured, information regarding [facility] provided patient can call tomorrow morning after 9am to  Follow up appointment and to establish primary care.
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
## 2017-11-25 NOTE — ED Notes (Signed)
Bed: WTR9 Expected date:  Expected time:  Means of arrival:  Comments: 

## 2017-11-27 ENCOUNTER — Emergency Department (HOSPITAL_COMMUNITY): Payer: No Typology Code available for payment source

## 2017-11-27 ENCOUNTER — Emergency Department (HOSPITAL_COMMUNITY)
Admission: EM | Admit: 2017-11-27 | Discharge: 2017-11-27 | Disposition: A | Discharge status: Home / Self Care | Payer: No Typology Code available for payment source | Attending: Emergency Medicine | Admitting: Emergency Medicine

## 2017-11-27 ENCOUNTER — Encounter (HOSPITAL_COMMUNITY): Payer: Self-pay | Admitting: Emergency Medicine

## 2017-11-27 DIAGNOSIS — H538 Other visual disturbances: Secondary | ICD-10-CM | POA: Insufficient documentation

## 2017-11-27 DIAGNOSIS — G44319 Acute post-traumatic headache, not intractable: Secondary | ICD-10-CM | POA: Diagnosis not present

## 2017-11-27 DIAGNOSIS — M545 Low back pain, unspecified: Secondary | ICD-10-CM

## 2017-11-27 DIAGNOSIS — R110 Nausea: Secondary | ICD-10-CM | POA: Diagnosis not present

## 2017-11-27 DIAGNOSIS — Y999 Unspecified external cause status: Secondary | ICD-10-CM | POA: Diagnosis not present

## 2017-11-27 DIAGNOSIS — S161XXA Strain of muscle, fascia and tendon at neck level, initial encounter: Secondary | ICD-10-CM | POA: Diagnosis not present

## 2017-11-27 DIAGNOSIS — Z041 Encounter for examination and observation following transport accident: Secondary | ICD-10-CM | POA: Diagnosis present

## 2017-11-27 DIAGNOSIS — Y939 Activity, unspecified: Secondary | ICD-10-CM | POA: Diagnosis not present

## 2017-11-27 DIAGNOSIS — S161XXD Strain of muscle, fascia and tendon at neck level, subsequent encounter: Secondary | ICD-10-CM

## 2017-11-27 DIAGNOSIS — Y929 Unspecified place or not applicable: Secondary | ICD-10-CM | POA: Insufficient documentation

## 2017-11-27 MED ORDER — ONDANSETRON 4 MG PO TBDP: 4.0000 mg | ORAL_TABLET | Freq: Three times a day (TID) | ORAL | 0 refills | Status: DC | PRN
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
## 2017-11-27 NOTE — ED Notes (Signed)
Pt given Incentive spirometer and teaching about how to use it.
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
## 2017-11-27 NOTE — ED Notes (Signed)
Patient transported to CT 

## 2018-01-11 ENCOUNTER — Encounter (HOSPITAL_COMMUNITY): Payer: Self-pay | Admitting: Emergency Medicine

## 2018-01-11 ENCOUNTER — Emergency Department (HOSPITAL_COMMUNITY)
Admission: EM | Admit: 2018-01-11 | Discharge: 2018-01-11 | Discharge status: Against Medical Advice | Payer: No Typology Code available for payment source | Attending: Emergency Medicine | Admitting: Emergency Medicine

## 2018-01-11 DIAGNOSIS — Z041 Encounter for examination and observation following transport accident: Secondary | ICD-10-CM | POA: Diagnosis present

## 2018-01-11 DIAGNOSIS — Z5321 Procedure and treatment not carried out due to patient leaving prior to being seen by health care provider: Secondary | ICD-10-CM | POA: Insufficient documentation
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
## 2018-01-11 NOTE — ED Notes (Signed)
Registration came to triage to report this patient left

## 2018-01-12 ENCOUNTER — Other Ambulatory Visit: Payer: Self-pay

## 2018-01-12 ENCOUNTER — Emergency Department (HOSPITAL_COMMUNITY)
Admission: EM | Admit: 2018-01-12 | Discharge: 2018-01-12 | Disposition: A | Discharge status: Home / Self Care | Payer: No Typology Code available for payment source | Attending: Emergency Medicine | Admitting: Emergency Medicine

## 2018-01-12 ENCOUNTER — Encounter (HOSPITAL_COMMUNITY): Payer: Self-pay | Admitting: *Deleted

## 2018-01-12 DIAGNOSIS — M25561 Pain in right knee: Secondary | ICD-10-CM | POA: Diagnosis present

## 2018-01-12 DIAGNOSIS — M25571 Pain in right ankle and joints of right foot: Secondary | ICD-10-CM | POA: Diagnosis not present

## 2018-01-12 DIAGNOSIS — Y999 Unspecified external cause status: Secondary | ICD-10-CM | POA: Diagnosis not present

## 2018-01-12 DIAGNOSIS — Y929 Unspecified place or not applicable: Secondary | ICD-10-CM | POA: Diagnosis not present

## 2018-01-12 DIAGNOSIS — Y939 Activity, unspecified: Secondary | ICD-10-CM | POA: Diagnosis not present
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
## 2018-01-28 ENCOUNTER — Ambulatory Visit (INDEPENDENT_AMBULATORY_CARE_PROVIDER_SITE_OTHER): Payer: Self-pay

## 2018-01-28 ENCOUNTER — Encounter (INDEPENDENT_AMBULATORY_CARE_PROVIDER_SITE_OTHER): Payer: Self-pay | Admitting: Orthopaedic Surgery

## 2018-01-28 ENCOUNTER — Ambulatory Visit (INDEPENDENT_AMBULATORY_CARE_PROVIDER_SITE_OTHER): Payer: Self-pay | Admitting: Orthopaedic Surgery

## 2018-01-28 VITALS — BP 127/84 | HR 85 | Ht 70.0 in | Wt 177.0 lb

## 2018-01-28 DIAGNOSIS — M25531 Pain in right wrist: Secondary | ICD-10-CM

## 2018-01-28 DIAGNOSIS — M25571 Pain in right ankle and joints of right foot: Secondary | ICD-10-CM

## 2018-01-28 DIAGNOSIS — G8929 Other chronic pain: Secondary | ICD-10-CM

## 2018-01-28 DIAGNOSIS — M25561 Pain in right knee: Secondary | ICD-10-CM

## 2018-01-28 DIAGNOSIS — S93491A Sprain of other ligament of right ankle, initial encounter: Secondary | ICD-10-CM

## 2018-01-28 DIAGNOSIS — M778 Other enthesopathies, not elsewhere classified: Secondary | ICD-10-CM

## 2018-01-28 MED ORDER — BUPIVACAINE HCL 0.25 % IJ SOLN
6.0000 mL | INTRAMUSCULAR | Status: AC | PRN
  Administered 2018-01-28: 6 mL via INTRA_ARTICULAR

## 2018-01-28 MED ORDER — METHYLPREDNISOLONE ACETATE 40 MG/ML IJ SUSP
80.0000 mg | INTRAMUSCULAR | Status: AC | PRN
  Administered 2018-01-28: 80 mg

## 2018-01-28 MED ORDER — LIDOCAINE HCL 1 % IJ SOLN
3.0000 mL | INTRAMUSCULAR | Status: AC | PRN
  Administered 2018-01-28: 3 mL
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
## 2018-03-12 ENCOUNTER — Ambulatory Visit (INDEPENDENT_AMBULATORY_CARE_PROVIDER_SITE_OTHER): Payer: Self-pay | Admitting: Orthopaedic Surgery

## 2018-03-22 ENCOUNTER — Ambulatory Visit (INDEPENDENT_AMBULATORY_CARE_PROVIDER_SITE_OTHER): Payer: Self-pay | Admitting: Orthopaedic Surgery

## 2018-04-09 ENCOUNTER — Ambulatory Visit (INDEPENDENT_AMBULATORY_CARE_PROVIDER_SITE_OTHER): Payer: Self-pay | Admitting: Orthopaedic Surgery

## 2018-04-09 ENCOUNTER — Encounter (INDEPENDENT_AMBULATORY_CARE_PROVIDER_SITE_OTHER): Payer: Self-pay | Admitting: Orthopaedic Surgery

## 2018-04-09 VITALS — BP 132/85 | HR 67 | Ht 70.0 in | Wt 177.0 lb

## 2018-04-09 DIAGNOSIS — M25561 Pain in right knee: Secondary | ICD-10-CM

## 2018-04-09 DIAGNOSIS — M25571 Pain in right ankle and joints of right foot: Secondary | ICD-10-CM
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
## 2018-06-11 ENCOUNTER — Ambulatory Visit (INDEPENDENT_AMBULATORY_CARE_PROVIDER_SITE_OTHER): Payer: Self-pay | Admitting: Orthopaedic Surgery

## 2018-06-11 ENCOUNTER — Encounter (INDEPENDENT_AMBULATORY_CARE_PROVIDER_SITE_OTHER): Payer: Self-pay | Admitting: Orthopaedic Surgery

## 2018-06-11 VITALS — BP 140/90 | HR 71 | Ht 70.0 in | Wt 177.0 lb

## 2018-06-11 DIAGNOSIS — G8929 Other chronic pain: Secondary | ICD-10-CM

## 2018-06-11 DIAGNOSIS — M25561 Pain in right knee: Secondary | ICD-10-CM

## 2018-06-11 NOTE — Addendum Note (Signed)
Addended by: Rogers SeedsYEATTS, Zakirah Weingart M on: 06/11/2018 10:20 AM   Modules accepted: Orders
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
## 2018-07-17 ENCOUNTER — Ambulatory Visit (INDEPENDENT_AMBULATORY_CARE_PROVIDER_SITE_OTHER): Payer: Self-pay | Admitting: Orthopaedic Surgery

## 2018-07-23 ENCOUNTER — Encounter (INDEPENDENT_AMBULATORY_CARE_PROVIDER_SITE_OTHER): Payer: Self-pay | Admitting: *Deleted

## 2018-07-25 ENCOUNTER — Telehealth (INDEPENDENT_AMBULATORY_CARE_PROVIDER_SITE_OTHER): Payer: Self-pay | Admitting: Orthopaedic Surgery

## 2018-07-25 NOTE — Telephone Encounter (Signed)
Can you please check into this?

## 2018-07-25 NOTE — Telephone Encounter (Signed)
Pt has been contacted.

## 2018-07-25 NOTE — Telephone Encounter (Signed)
Patients Friend Jasmine December called because no one has contacted them to set up MRI yet and patient really needs this done. Is there anything they need to be doing?    CB # [phone]

## 2018-08-15 ENCOUNTER — Telehealth (INDEPENDENT_AMBULATORY_CARE_PROVIDER_SITE_OTHER): Payer: Self-pay | Admitting: Orthopaedic Surgery

## 2018-08-15 NOTE — Telephone Encounter (Signed)
I CALLED DISCUSSED. No change .

## 2018-08-15 NOTE — Telephone Encounter (Signed)
Patient called and stated due to financial problems they would like to have the right ankle added to MRI order to keep from having to go twice.  Please call patient friend Mercie EonSharon Anderson to [phone].  Patient on phone gave verbal ok and permission.

## 2018-08-15 NOTE — Telephone Encounter (Signed)
Please advise 

## 2018-08-20 ENCOUNTER — Ambulatory Visit
Admission: RE | Admit: 2018-08-20 | Discharge: 2018-08-20 | Disposition: A | Discharge status: Home / Self Care | Payer: No Typology Code available for payment source | Source: Ambulatory Visit | Attending: Orthopaedic Surgery | Admitting: Orthopaedic Surgery

## 2018-08-20 DIAGNOSIS — M25561 Pain in right knee: Principal | ICD-10-CM

## 2018-08-20 DIAGNOSIS — G8929 Other chronic pain: Secondary | ICD-10-CM

## 2018-09-11 ENCOUNTER — Ambulatory Visit (INDEPENDENT_AMBULATORY_CARE_PROVIDER_SITE_OTHER): Payer: Self-pay | Admitting: Orthopaedic Surgery

## 2018-09-11 ENCOUNTER — Encounter (INDEPENDENT_AMBULATORY_CARE_PROVIDER_SITE_OTHER): Payer: Self-pay | Admitting: Orthopaedic Surgery

## 2018-09-11 VITALS — BP 142/87 | HR 72 | Ht 70.0 in | Wt 177.0 lb

## 2018-09-11 DIAGNOSIS — M25561 Pain in right knee: Secondary | ICD-10-CM

## 2018-09-11 DIAGNOSIS — M25531 Pain in right wrist: Secondary | ICD-10-CM

## 2018-09-11 DIAGNOSIS — M25571 Pain in right ankle and joints of right foot: Secondary | ICD-10-CM
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
## 2018-10-28 ENCOUNTER — Telehealth: Payer: Self-pay

## 2018-10-28 NOTE — Telephone Encounter (Signed)
Spoke with patient he verbalized an understanding and is coming in to be seen in [facility]

## 2018-11-26 ENCOUNTER — Telehealth (INDEPENDENT_AMBULATORY_CARE_PROVIDER_SITE_OTHER): Payer: Self-pay | Admitting: Orthopaedic Surgery

## 2018-11-26 NOTE — Telephone Encounter (Signed)
Received vm from Joetta Manners is on pts DPR) requesting call back.I returned call [phone] and she stated pt is needing records faxed to another office and proceeded to give me the information. I told her that the patient has to sign an authorization to release records before anything can be released. She became frustrated and said she "they would just make it on their own"

## 2018-11-29 ENCOUNTER — Telehealth (INDEPENDENT_AMBULATORY_CARE_PROVIDER_SITE_OTHER): Payer: Self-pay | Admitting: Orthopaedic Surgery

## 2018-11-29 NOTE — Telephone Encounter (Signed)
Placed CD on your desk  °

## 2018-11-29 NOTE — Telephone Encounter (Signed)
Sean Proctor, pts friend called for him. He needs xrays on CD. Please bring to me and I will mail with records to the facility he indicates on his release form.

## 2018-12-03 NOTE — Telephone Encounter (Signed)
Will mail CD & records when release form is received.

## 2018-12-17 ENCOUNTER — Telehealth (INDEPENDENT_AMBULATORY_CARE_PROVIDER_SITE_OTHER): Payer: Self-pay | Admitting: Orthopaedic Surgery

## 2018-12-17 NOTE — Telephone Encounter (Signed)
Patient's friend Mercie Eon) called asked if patient can be set up for an MRI of his right wrist and right ankle. The number to contact Jasmine December is [phone]

## 2018-12-17 NOTE — Telephone Encounter (Signed)
Can you please advise if this is something you would do in the future?  I can explain to patient that MRI scheduling is being limited due to COVID-19.

## 2018-12-17 NOTE — Telephone Encounter (Signed)
I called Sean Proctor and advised. She expressed that something needs to be done due to the pain and trouble Sean Proctor is having. I explained that we could make a follow up appointment to have these problems addressed.  Sean Proctor wanted to make appointment for June because she is susceptible to flu and has to stay in.  Appt scheduled.

## 2018-12-17 NOTE — Telephone Encounter (Signed)
His xrays were neg. He can come back in to see me if still with symptoms in 8 wks.

## 2019-03-04 ENCOUNTER — Ambulatory Visit: Payer: Self-pay | Admitting: Orthopaedic Surgery

## 2019-04-16 ENCOUNTER — Ambulatory Visit: Payer: Self-pay | Admitting: Orthopaedic Surgery

## 2019-11-19 ENCOUNTER — Telehealth: Payer: Self-pay | Admitting: *Deleted

## 2019-11-19 NOTE — Telephone Encounter (Signed)
Patient's friend Mercie Eon) called asked if patient can be set up for an MRI of his right wrist and right ankle. The number to contact [facility] is [phone], stated they asked about before the covid and thought they were scheduled for the MRI's but never heard. I dont see orders in. Please advise.

## 2019-11-19 NOTE — Telephone Encounter (Signed)
Patient has not been seen in the office since 2019.  I called Jasmine December and scheduled follow up appointment for next week.

## 2019-11-25 ENCOUNTER — Ambulatory Visit (INDEPENDENT_AMBULATORY_CARE_PROVIDER_SITE_OTHER): Payer: Self-pay | Admitting: Orthopaedic Surgery

## 2019-11-25 ENCOUNTER — Encounter: Payer: Self-pay | Admitting: Orthopaedic Surgery

## 2019-11-25 ENCOUNTER — Other Ambulatory Visit: Payer: Self-pay

## 2019-11-25 ENCOUNTER — Ambulatory Visit (INDEPENDENT_AMBULATORY_CARE_PROVIDER_SITE_OTHER): Payer: Self-pay

## 2019-11-25 VITALS — BP 143/89 | HR 73 | Ht 70.0 in | Wt 176.0 lb

## 2019-11-25 DIAGNOSIS — M25531 Pain in right wrist: Secondary | ICD-10-CM

## 2019-11-25 DIAGNOSIS — M25571 Pain in right ankle and joints of right foot: Secondary | ICD-10-CM
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
## 2019-12-01 ENCOUNTER — Telehealth: Payer: Self-pay

## 2019-12-01 NOTE — Telephone Encounter (Signed)
Patient called triage line. He wanted to check the status of his MRI referral for his right ankle. He saw Dr.Yates on 11/25/2019.  Please call patient at #[phone] or [phone] Thanks!

## 2019-12-01 NOTE — Telephone Encounter (Signed)
Pt already has appt scheduled at [facility], they called him at 10:50 this a.m.

## 2019-12-24 ENCOUNTER — Ambulatory Visit
Admission: RE | Admit: 2019-12-24 | Discharge: 2019-12-24 | Disposition: A | Discharge status: Home / Self Care | Payer: No Typology Code available for payment source | Source: Ambulatory Visit | Attending: Orthopaedic Surgery | Admitting: Orthopaedic Surgery

## 2019-12-24 ENCOUNTER — Other Ambulatory Visit: Payer: Self-pay

## 2019-12-24 DIAGNOSIS — M25571 Pain in right ankle and joints of right foot: Secondary | ICD-10-CM

## 2019-12-30 ENCOUNTER — Ambulatory Visit: Payer: Self-pay | Admitting: Orthopaedic Surgery

## 2019-12-30 ENCOUNTER — Other Ambulatory Visit: Payer: Self-pay

## 2019-12-30 ENCOUNTER — Ambulatory Visit (INDEPENDENT_AMBULATORY_CARE_PROVIDER_SITE_OTHER): Payer: Self-pay | Admitting: Orthopaedic Surgery

## 2019-12-30 ENCOUNTER — Encounter: Payer: Self-pay | Admitting: Orthopaedic Surgery

## 2019-12-30 DIAGNOSIS — M25571 Pain in right ankle and joints of right foot: Secondary | ICD-10-CM | POA: Insufficient documentation
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
## 2022-05-18 ENCOUNTER — Emergency Department (HOSPITAL_COMMUNITY)
Admission: EM | Admit: 2022-05-18 | Discharge: 2022-05-18 | Disposition: A | Discharge status: Home / Self Care | Payer: No Typology Code available for payment source | Attending: Emergency Medicine | Admitting: Emergency Medicine

## 2022-05-18 ENCOUNTER — Other Ambulatory Visit: Payer: Self-pay

## 2022-05-18 ENCOUNTER — Emergency Department (HOSPITAL_COMMUNITY): Payer: No Typology Code available for payment source

## 2022-05-18 ENCOUNTER — Encounter (HOSPITAL_COMMUNITY): Payer: Self-pay | Admitting: Emergency Medicine

## 2022-05-18 DIAGNOSIS — R0781 Pleurodynia: Secondary | ICD-10-CM | POA: Diagnosis present

## 2022-05-18 DIAGNOSIS — Y92410 Unspecified street and highway as the place of occurrence of the external cause: Secondary | ICD-10-CM | POA: Diagnosis not present

## 2022-05-18 MED ORDER — BACITRACIN ZINC 500 UNIT/GM EX OINT: TOPICAL_OINTMENT | Freq: Two times a day (BID) | CUTANEOUS | Status: DC

## 2022-05-18 MED ORDER — BACITRACIN ZINC 500 UNIT/GM EX OINT
TOPICAL_OINTMENT | Freq: Two times a day (BID) | CUTANEOUS | Status: DC
  Administered 2022-05-18: 1 via TOPICAL

## 2022-05-18 MED ORDER — CIPROFLOXACIN-DEXAMETHASONE 0.3-0.1 % OT SUSP: 4.0000 [drp] | Freq: Once | OTIC | Status: DC

## 2022-05-18 MED ORDER — LIDOCAINE 5 % EX PTCH
1.0000 | MEDICATED_PATCH | CUTANEOUS | Status: DC
Start: 2022-05-18 — End: 2022-05-19
  Administered 2022-05-18: 1 via TRANSDERMAL
  Filled 2022-05-18: qty 1
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
## 2022-05-18 NOTE — ED Notes (Signed)
Wound care provided to bilateral knees and elbows.  Bandages and gauze applied.  Home care instructions provided.  Patient voiced understanding
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
## 2022-05-18 NOTE — ED Notes (Signed)
Patient provided with incentive spirometry.  Instructions on use provided.  Returned demonstration completed

## 2022-05-18 NOTE — Discharge Instructions (Signed)
Your xray today was negative for fractures or problems with your lung. Now, the view that we got unfortunately took a look at the left side of your rib cage instead of your right so we can't say for certain that you don't have a rib fracture. Regardless we will treat for a suspected rib fracture with lidocaine patches, tylenol/motrin for pain and incentive spirometry to prevent development of pneumonia. Pick up OTC lidocaine patches called [employer] from your drug store.   Fortunately, the xray was able to look at the entirety of your lungs which appear normal and your mid back xray was also without acute fracture.   Return if you develop shortness of breath or difficulty breathing.

## 2022-09-25 DEATH — deceased
